# Patient Record
Sex: Female | Born: 2000 | Race: White | Hispanic: No | Marital: Single | Smoking: Never smoker
Health system: Southern US, Community
[De-identification: ages and names within clinical notes are randomized; demographics above are authoritative.]

## PROBLEM LIST (undated history)

## (undated) DIAGNOSIS — D6851 Activated protein C resistance: Secondary | ICD-10-CM

## (undated) DIAGNOSIS — J45909 Unspecified asthma, uncomplicated: Secondary | ICD-10-CM

## (undated) DIAGNOSIS — R87619 Unspecified abnormal cytological findings in specimens from cervix uteri: Secondary | ICD-10-CM

## (undated) HISTORY — DX: Unspecified abnormal cytological findings in specimens from cervix uteri: R87.619

## (undated) HISTORY — DX: Activated protein C resistance: D68.51

---
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
## 2019-07-12 ENCOUNTER — Ambulatory Visit: Payer: 59 | Attending: Internal Medicine

## 2019-07-12 DIAGNOSIS — Z20822 Contact with and (suspected) exposure to covid-19: Secondary | ICD-10-CM

## 2019-07-13 LAB — NOVEL CORONAVIRUS, NAA: SARS-CoV-2, NAA: NOT DETECTED

## 2019-07-26 ENCOUNTER — Ambulatory Visit: Payer: 59 | Attending: Internal Medicine

## 2019-07-26 DIAGNOSIS — Z20822 Contact with and (suspected) exposure to covid-19: Secondary | ICD-10-CM

## 2019-07-28 LAB — NOVEL CORONAVIRUS, NAA: SARS-CoV-2, NAA: NOT DETECTED

## 2019-08-03 ENCOUNTER — Other Ambulatory Visit: Payer: 59

## 2019-08-17 ENCOUNTER — Ambulatory Visit: Payer: 59

## 2019-08-17 ENCOUNTER — Ambulatory Visit: Payer: 59 | Attending: Internal Medicine

## 2019-08-17 DIAGNOSIS — Z20822 Contact with and (suspected) exposure to covid-19: Secondary | ICD-10-CM

## 2019-08-18 LAB — NOVEL CORONAVIRUS, NAA: SARS-CoV-2, NAA: NOT DETECTED

## 2019-08-23 ENCOUNTER — Ambulatory Visit: Payer: 59 | Attending: Internal Medicine

## 2019-08-23 DIAGNOSIS — Z20822 Contact with and (suspected) exposure to covid-19: Secondary | ICD-10-CM

## 2019-08-24 LAB — NOVEL CORONAVIRUS, NAA: SARS-CoV-2, NAA: NOT DETECTED

## 2019-12-28 ENCOUNTER — Telehealth: Payer: Self-pay | Admitting: Hematology

## 2019-12-28 NOTE — Telephone Encounter (Signed)
Received a new hem referral from Dr. Juliene Pina at [facility] for factor v leiden. Charlotte Jackson returned my call and has been scheduled to see Dr. Candise Che on 6/24 at 11am.  Pt aware toa rrive 15 minutes early.

## 2020-01-02 ENCOUNTER — Other Ambulatory Visit: Payer: Self-pay

## 2020-01-04 ENCOUNTER — Ambulatory Visit: Payer: No Typology Code available for payment source | Admitting: Nurse Practitioner

## 2020-01-04 ENCOUNTER — Other Ambulatory Visit: Payer: Self-pay

## 2020-01-04 ENCOUNTER — Encounter: Payer: Self-pay | Admitting: Nurse Practitioner

## 2020-01-04 VITALS — BP 110/78 | Ht 67.0 in | Wt 175.0 lb

## 2020-01-04 DIAGNOSIS — Z3009 Encounter for other general counseling and advice on contraception: Secondary | ICD-10-CM | POA: Diagnosis not present

## 2020-01-04 DIAGNOSIS — D6851 Activated protein C resistance: Secondary | ICD-10-CM | POA: Diagnosis not present

## 2020-01-04 NOTE — Progress Notes (Signed)
   Charlotte Jackson [birth year] [id number]   History:  19 y.o. G0 presents as new patient to establish care and discuss birth control. Sexually active over a month ago. Plans to visit boyfriend in July and would like contraception. Factor V Leiden, referral sent to hematology by Rocco Pauls for follow up with appointment on 01/10/20. Was on OCPs and Nuvaring in the past with monthly cycles. Just stopped Nuvaring prior to cycle last week. Taking Phentermine for weight loss management.   Gynecologic History Patient's last menstrual period was 12/27/2019. Period Cycle (Days): 28 Period Duration (Days): 5 Period Pattern: Regular Menstrual Flow: Heavy Menstrual Control: Tampon Dysmenorrhea: (!) Moderate Dysmenorrhea Symptoms: Cramping Contraception: none  Past medical history, past surgical history, family history and social history were all reviewed and documented in the EPIC chart.  ROS:  A ROS was performed and pertinent positives and negatives are included.  Exam:  Vitals:   01/04/20 0844  BP: 110/78  Weight: 175 lb (79.4 kg)  Height: 5\' 7"  (1.702 m)   Body mass index is 27.41 kg/m.  General appearance:  Normal. Appears well  Assessment/Plan:  19 y.o. G0 to establish care and discus birth control.   Encounter for counseling regarding contraception - Progestrone-only options discussed to include pill, Depo, Nexplanon, and IUD. She would like 12. Will check coverage and schedule insertion. She just had a cycle and would like it placed before her next one.   Factor V Leiden [facility]) - will avoid estrogen-containing contraception  Follow up for annual      [facility], 9:07 AM 01/04/2020

## 2020-01-04 NOTE — Patient Instructions (Addendum)
Health Maintenance, Female Adopting a healthy lifestyle and getting preventive care are important in promoting health and wellness. Ask your health care provider about:  The right schedule for you to have regular tests and exams.  Things you can do on your own to prevent diseases and keep yourself healthy. What should I know about diet, weight, and exercise? Eat a healthy diet   Eat a diet that includes plenty of vegetables, fruits, low-fat dairy products, and lean protein.  Do not eat a lot of foods that are high in solid fats, added sugars, or sodium. Maintain a healthy weight Body mass index (BMI) is used to identify weight problems. It estimates body fat based on height and weight. Your health care provider can help determine your BMI and help you achieve or maintain a healthy weight. Get regular exercise Get regular exercise. This is one of the most important things you can do for your health. Most adults should:  Exercise for at least 150 minutes each week. The exercise should increase your heart rate and make you sweat (moderate-intensity exercise).  Do strengthening exercises at least twice a week. This is in addition to the moderate-intensity exercise.  Spend less time sitting. Even light physical activity can be beneficial. Watch cholesterol and blood lipids Have your blood tested for lipids and cholesterol at 20 years of age, then have this test every 5 years. Have your cholesterol levels checked more often if:  Your lipid or cholesterol levels are high.  You are older than 20 years of age.  You are at high risk for heart disease. What should I know about cancer screening? Depending on your health history and family history, you may need to have cancer screening at various ages. This may include screening for:  Breast cancer.  Cervical cancer.  Colorectal cancer.  Skin cancer.  Lung cancer. What should I know about heart disease, diabetes, and high blood  pressure? Blood pressure and heart disease  High blood pressure causes heart disease and increases the risk of stroke. This is more likely to develop in people who have high blood pressure readings, are of African descent, or are overweight.  Have your blood pressure checked: ? Every 3-5 years if you are 18-39 years of age. ? Every year if you are 40 years old or older. Diabetes Have regular diabetes screenings. This checks your fasting blood sugar level. Have the screening done:  Once every three years after age 40 if you are at a normal weight and have a low risk for diabetes.  More often and at a younger age if you are overweight or have a high risk for diabetes. What should I know about preventing infection? Hepatitis B If you have a higher risk for hepatitis B, you should be screened for this virus. Talk with your health care provider to find out if you are at risk for hepatitis B infection. Hepatitis C Testing is recommended for:  Everyone born from 1945 through 1965.  Anyone with known risk factors for hepatitis C. Sexually transmitted infections (STIs)  Get screened for STIs, including gonorrhea and chlamydia, if: ? You are sexually active and are younger than 20 years of age. ? You are older than 20 years of age and your health care provider tells you that you are at risk for this type of infection. ? Your sexual activity has changed since you were last screened, and you are at increased risk for chlamydia or gonorrhea. Ask your health care provider if   you are at risk.  Ask your health care provider about whether you are at high risk for HIV. Your health care provider may recommend a prescription medicine to help prevent HIV infection. If you choose to take medicine to prevent HIV, you should first get tested for HIV. You should then be tested every 3 months for as long as you are taking the medicine. Pregnancy  If you are about to stop having your period (premenopausal) and  you may become pregnant, seek counseling before you get pregnant.  Take 400 to 800 micrograms (mcg) of folic acid every day if you become pregnant.  Ask for birth control (contraception) if you want to prevent pregnancy. Osteoporosis and menopause Osteoporosis is a disease in which the bones lose minerals and strength with aging. This can result in bone fractures. If you are 81 years old or older, or if you are at risk for osteoporosis and fractures, ask your health care provider if you should:  Be screened for bone loss.  Take a calcium or vitamin D supplement to lower your risk of fractures.  Be given hormone replacement therapy (HRT) to treat symptoms of menopause. Follow these instructions at home: Lifestyle  Do not use any products that contain nicotine or tobacco, such as cigarettes, e-cigarettes, and chewing tobacco. If you need help quitting, ask your health care provider.  Do not use street drugs.  Do not share needles.  Ask your health care provider for help if you need support or information about quitting drugs. Alcohol use  Do not drink alcohol if: ? Your health care provider tells you not to drink. ? You are pregnant, may be pregnant, or are planning to become pregnant.  If you drink alcohol: ? Limit how much you use to 0-1 drink a day. ? Limit intake if you are breastfeeding.  Be aware of how much alcohol is in your drink. In the [place] one drink equals one 12 oz bottle of beer (355 mL), one 5 oz glass of wine (148 mL), or one 1 oz glass of hard liquor (44 mL). General instructions  Schedule regular health, dental, and eye exams.  Stay current with your vaccines.  Tell your health care provider if: ? You often feel depressed. ? You have ever been abused or do not feel safe at home. Summary  Adopting a healthy lifestyle and getting preventive care are important in promoting health and wellness.  Follow your health care provider's instructions about healthy  diet, exercising, and getting tested or screened for diseases.  Follow your health care provider's instructions on monitoring your cholesterol and blood pressure. This information is not intended to replace advice given to you by your health care provider. Make sure you discuss any questions you have with your health care provider. Document Revised: 06/29/2018 Document Reviewed: 06/29/2018 Elsevier Patient Education  2020 Elsevier Inc[employer]  Intrauterine Device Insertion An intrauterine device (IUD) is a medical device that gets inserted into the uterus to prevent pregnancy. It is a small, T-shaped device that has one or two nylon strings hanging down from it. The strings hang out of the lower part of the uterus (cervix) to allow for future IUD removal. There are two types of IUDs available:  Copper IUD. This type of IUD has copper wire wrapped around it. Copper makes the uterus and fallopian tubes produce a fluid that kills sperm. A copper IUD may last up to 10 years.  Hormone IUD. This type of IUD is made of plastic  and contains the hormone progestin (synthetic progesterone). The hormone thickens mucus in the cervix and prevents sperm from entering the uterus. It also thins the uterine lining to prevent implantation of a fertilized egg. The hormone can weaken or kill the sperm that get into the uterus. A hormone IUD may last 3-5 years. Tell a health care provider about:  Any allergies you have.  All medicines you are taking, including vitamins, herbs, eye drops, creams, and over-the-counter medicines.  Any problems you or family members have had with anesthetic medicines.  Any blood disorders you have.  Any surgeries you have had.  Any medical conditions you have, including any STIs (sexually transmitted infections) you may have.  Whether you are pregnant or may be pregnant. What are the risks? Generally, this is a safe procedure. However, problems may occur,  including:  Infection.  Bleeding.  Allergic reactions to medicines.  Accidental puncture (perforation) of the uterus, or damage to other structures or organs.  Accidental placement of the IUD either in the muscle layer of the uterus (myometrium) or outside the uterus.  The IUD falling out of the uterus (expulsion). This is more common among women who have recently had a child.  Pregnancy that happens in the fallopian tube (ectopic pregnancy).  Infection of the uterus and fallopian tubes (pelvic inflammatory disease). What happens before the procedure?  Schedule the IUD insertion for when you will have your menstrual period or right after, to make sure you are not pregnant. Placement of the IUD is better tolerated shortly after a menstrual cycle.  Follow instructions from your health care provider about eating or drinking restrictions.  Ask your health care provider about changing or stopping your regular medicines. This is especially important if you are taking diabetes medicines or blood thinners.  You may get a pain reliever to take before the procedure.  You may have tests for: ? Pregnancy. A pregnancy test involves having a urine sample taken. ? STIs. Placing an IUD in someone who has an STI can make the infection worse. ? Cervical cancer. You may have a Pap test to check for this type of cancer. This means collecting cells from your cervix to be examined under a microscope.  You may have a physical exam to determine the size and position of your uterus. The procedure may vary among health care providers and hospitals. What happens during the procedure?  A tool (speculum) will be placed in your vagina and widened so that your health care provider can see your cervix.  Medicine may be applied to your cervix to help lower your risk of infection (antiseptic medicine).  You may be given an anesthetic medicine to numb each side of your cervix (intracervical block or  paracervical block). This medicine is usually given by an injection into the cervix.  A tool (uterine sound) will be inserted into your uterus to determine the length of your uterus and the direction that your uterus may be tilted.  A slim instrument or tube (IUD inserter) that holds the IUD will be inserted into your vagina, through your cervical canal, and into your uterus.  The IUD will be placed in the uterus, and the IUD inserter will be removed.  The strings that are attached to the IUD will be trimmed so that they lie just below the cervix. The procedure may vary among health care providers and hospitals. What happens after the procedure?  You may have bleeding after the procedure. This is normal. It varies  from light bleeding (spotting) for a few days to menstrual-like bleeding.  You may have cramping and pain.  You may feel dizzy or light-headed.  You may have lower back pain. Summary  An intrauterine device (IUD) is a small, T-shaped device that has one or two nylon strings hanging down from it.  Two types of IUDs are available. You may have a copper IUD or a hormone IUD.  Schedule the IUD insertion for when you will have your menstrual period or right after, to make sure you are not pregnant. Placement of the IUD is better tolerated shortly after a menstrual cycle.  You may have bleeding after the procedure. This is normal. It varies from light spotting for a few days to menstrual-like bleeding. This information is not intended to replace advice given to you by your health care provider. Make sure you discuss any questions you have with your health care provider. Document Revised: 06/18/2017 Document Reviewed: 05/27/2016 Elsevier Patient Education  2020 [employer].

## 2020-01-10 NOTE — Progress Notes (Signed)
HEMATOLOGY/ONCOLOGY CONSULTATION NOTE  Date of Service: 01/11/2020  Patient Care Team: System, Pcp Not In as PCP - General  REFERRING PHYSICIAN: Shea Evans, MD  CHIEF COMPLAINTS/PURPOSE OF CONSULTATION:  Factor V Leiden   HISTORY OF PRESENTING ILLNESS:   Charlotte Jackson is a wonderful 19 y.o. female who has been referred to [place] by Shea Evans, MD for evaluation and management of Factor V Leiden. The pt reports that she is doing well overall.   The pt reports she is good. She has questions on risk factors of factor V Leiden with pregnancy and labor. She was diagnosed with depression/anxiety and was put on zoloft, but she stopped taking the medication since she did not want to be medicated, but may be looking to get back on medication. She has a lot going on and is currently back in therapy.   Pt is getting back to the gym recently. She quit competitive swim in 2019 and was put on birth control. From that she gained weight and could not get rid of it. She has been on phentermine recently and lost about 15 pounds.   Her father was helping her sister move and had a PE at 61 years old. He then found out that he had factor V Leiden. The Factor V Leiden mutation was thought to have come from the paternal grandfather.   Pt takes oral iron when she feels dizzy and fatigued. She does have ice cravings sometimes. Pt previously had heavy menstrual bleeding and she has been looking onto birth control.   Most recent lab results (08/03/19) of CBC is as follows: all values are WNL except for WBC at Few 08/03/19 of GC/Chlamydia (PDL) at Positive  08/03/19 of Hemoglobin A1c at 4.8: WNL 08/03/19 of TSH at 1.85: WNL 11/27/19 of Factor V (leiden) Mutation  Positive for heterozygous mutation.  On review of systems, pt reports staying active, losing weight and denies abdominal pain, pedal edema and any other symptoms.   On PMHx the pt reports asthma, ADHD, depression/anxeity, never had blood  clots, never been pregnant, no past surgeries   On Social Hx the pt reports vaped 2.5 years ago, light marijuana use   On Family Hx the pt reports father has factor V Leiden mutation, mother has no clotting disorders, paternal grandfather had heart disease, maternal grandmother had lung disease, paternal grandmother has OCD, depression, anxiety, sister had depression and anxiety   MEDICAL HISTORY:  Past Medical History:  Diagnosis Date  . Asthma   . Factor 5 Leiden mutation, heterozygous (HCC)      SURGICAL HISTORY: No past surgical history on file.   SOCIAL HISTORY: Social History   Socioeconomic History  . Marital status: Single    Spouse name: Not on file  . Number of children: Not on file  . Years of education: Not on file  . Highest education level: Not on file  Occupational History  . Not on file  Tobacco Use  . Smoking status: Never Smoker  . Smokeless tobacco: Never Used  Substance and Sexual Activity  . Alcohol use: Yes  . Drug use: Yes    Types: Marijuana  . Sexual activity: Not Currently    Comment: intercoure age 77, less than 5 sexua partners  Other Topics Concern  . Not on file  Social History Narrative  . Not on file   Social Determinants of Health   Financial Resource Strain:   . Difficulty of Paying Living Expenses:   Food Insecurity:   .  Worried About Programme researcher, broadcasting/film/video in the Last Year:   . Barista in the Last Year:   Transportation Needs:   . Freight forwarder (Medical):   Marland Kitchen Lack of Transportation (Non-Medical):   Physical Activity:   . Days of Exercise per Week:   . Minutes of Exercise per Session:   Stress:   . Feeling of Stress :   Social Connections:   . Frequency of Communication with Friends and Family:   . Frequency of Social Gatherings with Friends and Family:   . Attends Religious Services:   . Active Member of Clubs or Organizations:   . Attends Banker Meetings:   Marland Kitchen Marital Status:   Intimate  Partner Violence:   . Fear of Current or Ex-Partner:   . Emotionally Abused:   Marland Kitchen Physically Abused:   . Sexually Abused:      FAMILY HISTORY: Family History  Problem Relation Age of Onset  . Factor V Leiden deficiency Father   . Lung disease Maternal Grandmother   . Heart disease Paternal Grandfather      ALLERGIES:   has No Known Allergies.   MEDICATIONS:  Current Outpatient Medications  Medication Sig Dispense Refill  . albuterol (PROVENTIL) (2.5 MG/3ML) 0.083% nebulizer solution Take 2.5 mg by nebulization every 6 (six) hours as needed for wheezing or shortness of breath.    . phentermine (ADIPEX-P) 37.5 MG tablet Take 37.5 mg by mouth every morning.     No current facility-administered medications for this visit.     REVIEW OF SYSTEMS:   A 10+ POINT REVIEW OF SYSTEMS WAS OBTAINED including neurology, dermatology, psychiatry, cardiac, respiratory, lymph, extremities, GI, GU, Musculoskeletal, constitutional, breasts, reproductive, HEENT.  All pertinent positives are noted in the HPI.  All others are negative.   PHYSICAL EXAMINATION: ECOG PERFORMANCE STATUS: 0 - Asymptomatic  There were no vitals filed for this visit. There were no vitals filed for this visit. There is no height or weight on file to calculate BMI.  GENERAL:alert, in no acute distress and comfortable SKIN: no acute rashes, no significant lesions EYES: conjunctiva are pink and non-injected, sclera anicteric OROPHARYNX: MMM, no exudates, no oropharyngeal erythema or ulceration NECK: supple, no JVD LYMPH:  no palpable lymphadenopathy in the cervical, axillary or inguinal regions LUNGS: clear to auscultation b/l with normal respiratory effort HEART: regular rate & rhythm ABDOMEN:  normoactive bowel sounds , non tender, not distended. Extremity: no pedal edema PSYCH: alert & oriented x 3 with fluent speech NEURO: no focal motor/sensory deficits  LABORATORY DATA:  I have reviewed the data as  listed  No flowsheet data found.  No flowsheet data found.    RADIOGRAPHIC STUDIES: I have personally reviewed the radiological images as listed and agreed with the findings in the report. No results found.   ASSESSMENT & PLAN:  Charlotte Jackson is a 19 y.o. female with:  1. Heterozygous factor V leiden mutation.   PLAN: -Discussed patient's most recent labs from 08/03/19, of CBC is as follows: all values are WNL except for WBC at Few -Discussed 08/03/19 of GC/Chlamydia (PDL) at Positive  -Discussed 08/03/19 of Hemoglobin A1c at 4.8: WNL -Discussed 08/03/19 of TSH at 1.85: WNL -Discussed 11/27/19 of Factor V (leiden) Mutation at Positive -Advised on iron deficiency/Menorrhagia -Advised on Pica (ice cravings)  -Advised on healthy eating, staying active  -Advised on Heterozygous for factor V Leiden  -Advised on Factor V Leiden Mutation  -Educated on risk for heterozygous  presentation of Factor V Leiden -age, hormonal contraceptives, pregnancies, surgeries, long distance travel, obesity  -Educated on blood clots with provoked vs unprovoked events (life-long blood thinners or not) -Educated on pregnancy with factor V Leiden mutation -preventive blood thinners during/6 weeks after delivery -Educated surgeries -need preventive dose of blood thinners before and after surgery  -Advised no hormonal birth control   -Recommend staying active, reasonable body weight, moving while going long distance, stay hydrated, avoid alcohol while traveling, no smoking/vaping, avoid hormonal contraception, wear compression socks while traveling  -Recommend getting Prothrombin gene mutation test  -Recommend getting ferritin and iron saturations checked  -Recommend taking OTC oral iron -ferrous sulfate, or 150mg  iron polysaccharide  -Will see back as needed   FOLLOW UP RTC with Dr Irene Limbo as needed  The total time spent in the appt was 45 minutes and more than 50% was on counseling and direct patient  cares.  All of the patient's questions were answered with apparent satisfaction. The patient knows to call the clinic with any problems, questions or concerns.   Sullivan Lone MD [place] AAHIVMS [facility] Hematology/Oncology Physician [facility]  (Office):       [phone] (Work cell):  [phone] (Fax):           [phone]  01/11/2020 8:36 AM  I, Dawayne Cirri am acting as a scribe for Dr. Sullivan Lone.   .I have reviewed the above documentation for accuracy and completeness, and I agree with the above. Brunetta Genera MD

## 2020-01-11 ENCOUNTER — Inpatient Hospital Stay: Payer: Managed Care, Other (non HMO)

## 2020-01-11 ENCOUNTER — Inpatient Hospital Stay: Payer: Managed Care, Other (non HMO) | Attending: Hematology | Admitting: Hematology

## 2020-01-11 ENCOUNTER — Other Ambulatory Visit: Payer: Self-pay

## 2020-01-11 VITALS — BP 120/76 | HR 95 | Temp 98.5°F | Resp 18 | Wt 177.4 lb

## 2020-01-11 DIAGNOSIS — F418 Other specified anxiety disorders: Secondary | ICD-10-CM | POA: Diagnosis not present

## 2020-01-11 DIAGNOSIS — N920 Excessive and frequent menstruation with regular cycle: Secondary | ICD-10-CM | POA: Diagnosis not present

## 2020-01-11 DIAGNOSIS — Z79899 Other long term (current) drug therapy: Secondary | ICD-10-CM | POA: Insufficient documentation

## 2020-01-11 DIAGNOSIS — J45909 Unspecified asthma, uncomplicated: Secondary | ICD-10-CM | POA: Diagnosis not present

## 2020-01-11 DIAGNOSIS — F909 Attention-deficit hyperactivity disorder, unspecified type: Secondary | ICD-10-CM | POA: Diagnosis not present

## 2020-01-11 DIAGNOSIS — D6851 Activated protein C resistance: Secondary | ICD-10-CM | POA: Insufficient documentation

## 2020-01-29 ENCOUNTER — Other Ambulatory Visit: Payer: Self-pay | Admitting: Obstetrics & Gynecology

## 2020-02-02 ENCOUNTER — Encounter: Payer: Self-pay | Admitting: Obstetrics & Gynecology

## 2020-02-02 ENCOUNTER — Ambulatory Visit (INDEPENDENT_AMBULATORY_CARE_PROVIDER_SITE_OTHER): Payer: No Typology Code available for payment source | Admitting: Obstetrics & Gynecology

## 2020-02-02 ENCOUNTER — Other Ambulatory Visit: Payer: Self-pay

## 2020-02-02 VITALS — BP 122/80

## 2020-02-02 DIAGNOSIS — D6851 Activated protein C resistance: Secondary | ICD-10-CM

## 2020-02-02 DIAGNOSIS — Z30430 Encounter for insertion of intrauterine contraceptive device: Secondary | ICD-10-CM

## 2020-02-02 NOTE — Progress Notes (Signed)
    Charlotte [facility] June 07, 2001 [id number]        19 y.o.  G0 Single  RP: Kyleena IUD insertion  HPI: Recently stopped Nuvaring.  Factor V Leiden carrier.  Decided to start a Non-Estrogen contraception, chose [place] IUD.   OB History  Gravida Para Term Preterm AB Living  0 0 0 0 0 0  SAB TAB Ectopic Multiple Live Births  0 0 0 0 0    Past medical history,surgical history, problem list, medications, allergies, family history and social history were all reviewed and documented in the EPIC chart.   Directed ROS with pertinent positives and negatives documented in the history of present illness/assessment and plan.  Exam:  Vitals:   02/02/20 1134  BP: 122/80   General appearance:  Normal                                                                    IUD procedure note       Patient presented to the office today for placement of Kyleena IUD. The patient had previously been provided with literature information on this method of contraception. The risks benefits and pros and cons were discussed and all her questions were answered. She is fully aware that this form of contraception is 99% effective and is good for 5 years.  Pelvic exam: Vulva normal Vagina: No lesions or discharge Cervix: No lesions or discharge Uterus: AV position, normal volume Adnexa: No masses or tenderness Rectal exam: Not done  The cervix was cleansed with Betadine solution. Hurricane spray on the cervix.  A single-tooth tenaculum was placed on the anterior cervical lip. Hysterometry with the Os finder was 7 cm. The IUD was shown to the patient and inserted in a sterile fashion. The IUD string was trimmed. The single-tooth tenaculum was removed. Patient was instructed to return back to the office in one month for follow up.         Assessment/Plan:  20 y.o. G0P0000   1. Encounter for IUD insertion Decision to change to a progesterone type of contraception given factor V Leyden carrier status.   Kyleena IUD insertion today.  Counseling on Orting IUD done.  Easy insertion without complication.  Well-tolerated by patient.  Postprocedure precautions reviewed.  Follow-up in two weeks for IUD check.  2. Factor V Leiden (HCC) Factor V Leiden carrier.  Genia Del MD, 11:58 AM 02/02/2020

## 2020-02-05 ENCOUNTER — Encounter: Payer: Self-pay | Admitting: Obstetrics & Gynecology

## 2020-02-07 ENCOUNTER — Encounter: Payer: Self-pay | Admitting: Anesthesiology

## 2020-02-19 ENCOUNTER — Encounter: Payer: Self-pay | Admitting: Obstetrics & Gynecology

## 2020-02-19 ENCOUNTER — Other Ambulatory Visit: Payer: Self-pay

## 2020-02-19 ENCOUNTER — Ambulatory Visit: Payer: No Typology Code available for payment source | Admitting: Obstetrics & Gynecology

## 2020-02-19 VITALS — BP 120/78

## 2020-02-19 DIAGNOSIS — Z30431 Encounter for routine checking of intrauterine contraceptive device: Secondary | ICD-10-CM | POA: Diagnosis not present

## 2020-02-19 NOTE — Progress Notes (Signed)
    Charlotte Jackson [facility] 08/04/2000 [id number]        19 y.o.  G0P0000   PP:IRJJOAC IUD check at 2 weeks  HPI: No pelvic pain.  No abnormal vaginal discharge.  Very mild spotting.  No fever.   OB History  Gravida Para Term Preterm AB Living  0 0 0 0 0 0  SAB TAB Ectopic Multiple Live Births  0 0 0 0 0    Past medical history,surgical history, problem list, medications, allergies, family history and social history were all reviewed and documented in the EPIC chart.   Directed ROS with pertinent positives and negatives documented in the history of present illness/assessment and plan.  Exam:  Vitals:   02/19/20 1424  BP: 120/78   General appearance:  Normal  Abdomen: Normal  Gynecologic exam: Vulva normal.  Speculum: Cervix and vagina normal.  IUD strings visible at the external os.  Normal vaginal secretions.  No vaginal bleeding.   Assessment/Plan:  19 y.o. G0P0000   1. Encounter for routine checking of intrauterine contraceptive device (IUD) Skyla IUD well-tolerated and in good position.  No sign of infection.  Patient reassured.  Follow-up annual gynecologic exam.  Genia Del MD, 2:49 PM 02/19/2020

## 2020-02-23 ENCOUNTER — Encounter: Payer: Self-pay | Admitting: Obstetrics & Gynecology

## 2020-03-12 ENCOUNTER — Other Ambulatory Visit: Payer: Self-pay | Admitting: Nurse Practitioner

## 2020-03-12 ENCOUNTER — Encounter: Payer: Self-pay | Admitting: *Deleted

## 2020-03-12 MED ORDER — METRONIDAZOLE 0.75 % VA GEL: 1.0000 | Freq: Every day | VAGINAL | 0 refills | Status: DC

## 2020-03-12 NOTE — Telephone Encounter (Signed)
-----   Message from Charlotte Mackie, NP sent at 03/12/2020  2:26 PM EDT ----- Please send in Metrogel 0.75% nightly x 5 days. She is off at school and I do not have the pharmacy she would like to use. Thank you

## 2020-09-10 ENCOUNTER — Encounter: Payer: Self-pay | Admitting: Hematology

## 2020-11-19 ENCOUNTER — Other Ambulatory Visit: Payer: Self-pay | Admitting: Nurse Practitioner

## 2020-11-19 ENCOUNTER — Encounter: Payer: Self-pay | Admitting: Nurse Practitioner

## 2020-11-19 MED ORDER — FLUCONAZOLE 150 MG PO TABS: ORAL_TABLET | ORAL | 0 refills | Status: DC

## 2021-04-24 ENCOUNTER — Other Ambulatory Visit: Payer: Self-pay | Admitting: Nurse Practitioner

## 2021-10-01 DIAGNOSIS — Z113 Encounter for screening for infections with a predominantly sexual mode of transmission: Secondary | ICD-10-CM | POA: Diagnosis not present

## 2021-10-01 DIAGNOSIS — B3731 Acute candidiasis of vulva and vagina: Secondary | ICD-10-CM | POA: Diagnosis not present

## 2021-10-01 DIAGNOSIS — Z202 Contact with and (suspected) exposure to infections with a predominantly sexual mode of transmission: Secondary | ICD-10-CM | POA: Diagnosis not present

## 2021-10-07 DIAGNOSIS — Z124 Encounter for screening for malignant neoplasm of cervix: Secondary | ICD-10-CM | POA: Diagnosis not present

## 2021-11-21 DIAGNOSIS — R300 Dysuria: Secondary | ICD-10-CM | POA: Diagnosis not present

## 2021-11-21 DIAGNOSIS — R350 Frequency of micturition: Secondary | ICD-10-CM | POA: Diagnosis not present

## 2022-05-11 DIAGNOSIS — Z202 Contact with and (suspected) exposure to infections with a predominantly sexual mode of transmission: Secondary | ICD-10-CM | POA: Diagnosis not present

## 2022-05-11 DIAGNOSIS — Z20818 Contact with and (suspected) exposure to other bacterial communicable diseases: Secondary | ICD-10-CM | POA: Diagnosis not present

## 2022-08-05 DIAGNOSIS — Z113 Encounter for screening for infections with a predominantly sexual mode of transmission: Secondary | ICD-10-CM | POA: Diagnosis not present

## 2022-11-25 DIAGNOSIS — Z124 Encounter for screening for malignant neoplasm of cervix: Secondary | ICD-10-CM | POA: Diagnosis not present

## 2022-11-25 DIAGNOSIS — R87610 Atypical squamous cells of undetermined significance on cytologic smear of cervix (ASC-US): Secondary | ICD-10-CM | POA: Diagnosis not present

## 2022-11-25 DIAGNOSIS — R87810 Cervical high risk human papillomavirus (HPV) DNA test positive: Secondary | ICD-10-CM | POA: Diagnosis not present

## 2022-12-09 ENCOUNTER — Ambulatory Visit (INDEPENDENT_AMBULATORY_CARE_PROVIDER_SITE_OTHER): Payer: No Typology Code available for payment source | Admitting: Obstetrics & Gynecology

## 2022-12-09 ENCOUNTER — Encounter: Payer: Self-pay | Admitting: Obstetrics & Gynecology

## 2022-12-09 ENCOUNTER — Telehealth: Payer: Self-pay | Admitting: *Deleted

## 2022-12-09 VITALS — BP 110/70 | HR 77

## 2022-12-09 DIAGNOSIS — R1032 Left lower quadrant pain: Secondary | ICD-10-CM

## 2022-12-09 DIAGNOSIS — Z975 Presence of (intrauterine) contraceptive device: Secondary | ICD-10-CM | POA: Diagnosis not present

## 2022-12-09 NOTE — Telephone Encounter (Signed)
-----   Message from Genia Del, MD sent at 12/09/2022  3:35 PM EDT ----- Regarding: Pelvic US this week Left pelvic pain.  [place] on [place].

## 2022-12-09 NOTE — Telephone Encounter (Signed)
Spoke with patient.   PUS scheduled at [facility] for 5/23 at 0830, arrive at 0815.   Patient verbalizes understanding and is agreeable.    Order placed and business office notified.   Dr. Seymour Bars -does patient need consult with you as well?

## 2022-12-09 NOTE — Progress Notes (Signed)
    Charlotte Jackson 02-Nov-2000 [id number]        22 y.o.  G0 Stable boyfriend  RP: Left lower abdominal pain  HPI: Well on Kyleena x 02/02/2020.  No menses or BTB on it.  Pap Neg/STI screen Neg per patient, done recently at [facility] in [place].  C/O Left lower abdominal knot x about 1 month.  Checking on it often, now becoming more tender.  Lost 9 Lbs recently purposely.  Feeling occasionally bloated.  Mild constipation.  Urine normal.  No fever.   OB History  Gravida Para Term Preterm AB Living  0 0 0 0 0 0  SAB IAB Ectopic Multiple Live Births  0 0 0 0 0    Past medical history,surgical history, problem list, medications, allergies, family history and social history were all reviewed and documented in the EPIC chart.   Directed ROS with pertinent positives and negatives documented in the history of present illness/assessment and plan.  Exam:  Vitals:   12/09/22 1514  BP: 110/70  Pulse: 77  SpO2: 99%   General appearance:  Normal  Abdomen: Soft, not distended.  Raising the head in decubitus dorsal to increase the abdominal pressure: No evidence of hernia.  Adipose tissue feels mildly heterogeneous, but the same everywhere at the Rt and Lt abdominal sides.    Gynecologic exam: Vulva normal.  Bimanual exam: Uterus AV, normal volume, mobile, NT.  No adnexal mass felt, NT bilaterally.  IUD strings felt at [facility].   Assessment/Plan:  22 y.o. G0   1. Left lower quadrant abdominal pain Well on Kyleena x 02/02/2020.  No menses or BTB on it.  Pap Neg/STI screen Neg per patient, done recently at [facility] in [place].  C/O Left lower abdominal knot x about 1 month.  Checking on it often, now becoming more tender.  Lost 9 Lbs recently purposely.  Feeling occasionally bloated.  Mild constipation.  Urine normal.  No fever. Abdominal and Gyn exam normal.  No evidence of hernia or Left ovarian cyst or mass.  Will complete the investigation with a Pelvic US. - US Transvaginal Non-OB;  Future  2. Uses hormone releasing intrauterine device (IUD) for contraception Well on Kyleena x 02/02/2020.  No menses or BTB on it. IUD in good position.  Will check by Pelvic US as well.  Other orders - Probiotic Product (PROBIOTIC PO); Take by mouth. - Cranberry-Vitamin C-Probiotic (AZO CRANBERRY PO); Take by mouth.   Genia Del MD, 3:18 PM 12/09/2022

## 2022-12-10 ENCOUNTER — Ambulatory Visit (INDEPENDENT_AMBULATORY_CARE_PROVIDER_SITE_OTHER): Payer: BC Managed Care – PPO | Admitting: Obstetrics & Gynecology

## 2022-12-10 ENCOUNTER — Ambulatory Visit (INDEPENDENT_AMBULATORY_CARE_PROVIDER_SITE_OTHER): Payer: BC Managed Care – PPO

## 2022-12-10 ENCOUNTER — Encounter: Payer: Self-pay | Admitting: Obstetrics & Gynecology

## 2022-12-10 VITALS — BP 108/64 | HR 73

## 2022-12-10 DIAGNOSIS — R1032 Left lower quadrant pain: Secondary | ICD-10-CM | POA: Diagnosis not present

## 2022-12-10 DIAGNOSIS — Z975 Presence of (intrauterine) contraceptive device: Secondary | ICD-10-CM

## 2022-12-10 NOTE — Progress Notes (Signed)
    Charlotte Jackson 05/08/2001 [id number]        22 y.o.  G0  RP: Left lower abdominal pain/lump and IUD check with Pelvic US  HPI: Patient seen yesterday 12/09/22:  Well on Kyleena x 02/02/2020. No menses or BTB on it. Pap Neg/STI screen Neg per patient, done recently at [facility] in [place]. C/O Left lower abdominal knot x about 1 month. Checking on it often, now becoming more tender. Lost 9 Lbs recently purposely. Feeling occasionally bloated. Mild constipation. Urine normal. No fever.    OB History  Gravida Para Term Preterm AB Living  0 0 0 0 0 0  SAB IAB Ectopic Multiple Live Births  0 0 0 0 0    Past medical history,surgical history, problem list, medications, allergies, family history and social history were all reviewed and documented in the EPIC chart.   Directed ROS with pertinent positives and negatives documented in the history of present illness/assessment and plan.  Exam:  Vitals:   12/10/22 0935  BP: 108/64  Pulse: 73  SpO2: 97%   General appearance:  Normal  Pelvic US today: T/V images.  Retroverted uterus normal in size and shape with no myometrial mass.  The uterus is measured at 6.34 x 3.98 x 3.94 cm.  The endometrial lining is thin and symmetrical measured at 3.29 mm with no mass or thickening seen.  The IUD is seen in proper position within the uterine cavity.  Both ovaries are mobile, normal in size with normal follicular pattern and normal perfusion.  No adnexal mass.  No free fluid in the pelvis.   Assessment/Plan:  22 y.o. G0P0000   1. Left lower quadrant abdominal pain Well on Kyleena x 02/02/2020. No menses or BTB on it. Pap Neg/STI screen Neg per patient, done recently at [facility] in [place]. C/O Left lower abdominal knot x about 1 month. Checking on it often, now becoming more tender. Lost 9 Lbs recently purposely. Feeling occasionally bloated. Mild constipation. Urine normal. No fever.  Pelvic US completely normal with bilateral normal  ovaries and IUD in good IU location.  Reassured.  2. Uses hormone releasing intrauterine device (IUD) for contraception  IUD in good IU location by [place].  Reassured.  Genia Del MD, 9:50 AM 12/10/2022

## 2022-12-16 NOTE — Telephone Encounter (Signed)
Perr review of EPIC, patient was seen in office on 12/10/22.   Encounter closed.

## 2023-01-05 DIAGNOSIS — R399 Unspecified symptoms and signs involving the genitourinary system: Secondary | ICD-10-CM | POA: Diagnosis not present
